# Patient Record
Sex: Male | Born: 1965 | Race: Black or African American | Hispanic: No | Marital: Married | Smoking: Current some day smoker
Health system: Southern US, Community
[De-identification: ages and names within clinical notes are randomized; demographics above are authoritative.]

## PROBLEM LIST (undated history)

## (undated) DIAGNOSIS — I10 Essential (primary) hypertension: Secondary | ICD-10-CM

## (undated) HISTORY — DX: Essential (primary) hypertension: I10

---

## 2013-04-30 ENCOUNTER — Ambulatory Visit: Payer: BC Managed Care – PPO

## 2013-04-30 ENCOUNTER — Ambulatory Visit (INDEPENDENT_AMBULATORY_CARE_PROVIDER_SITE_OTHER): Payer: BC Managed Care – PPO | Admitting: Family Medicine

## 2013-04-30 VITALS — BP 110/80 | HR 77 | Temp 98.0°F | Resp 16 | Ht 73.5 in | Wt 226.0 lb

## 2013-04-30 DIAGNOSIS — M62830 Muscle spasm of back: Secondary | ICD-10-CM

## 2013-04-30 DIAGNOSIS — M545 Low back pain, unspecified: Secondary | ICD-10-CM

## 2013-04-30 DIAGNOSIS — S39012A Strain of muscle, fascia and tendon of lower back, initial encounter: Secondary | ICD-10-CM

## 2013-04-30 LAB — POCT URINALYSIS DIPSTICK
Bilirubin, UA: NEGATIVE
Blood, UA: NEGATIVE
Glucose, UA: NEGATIVE
KETONES UA: NEGATIVE
Nitrite, UA: NEGATIVE
PH UA: 6
Protein, UA: NEGATIVE
Spec Grav, UA: 1.025
Urobilinogen, UA: 0.2

## 2013-04-30 MED ORDER — MELOXICAM 7.5 MG PO TABS: 7.5000 mg | ORAL_TABLET | Freq: Every day | ORAL | Status: DC

## 2013-04-30 MED ORDER — CYCLOBENZAPRINE HCL 5 MG PO TABS
ORAL_TABLET | ORAL | Status: DC
Start: 2013-04-30 — End: 2024-01-22

## 2013-04-30 NOTE — Progress Notes (Signed)
Subjective:    Patient ID: Danny Lopez, male    DOB: 11/01/1965, 48 y.o.   MRN: [id number]  HPI Danny Lopez is a 48 y.o. male  Started about 6 days ago - shooting pain, R lower back only, NKI, no leg radiation. No trouble urinating, no hematuria, no fever, no rash.  No bowel or bladder incontinence, no saddle anesthesia, no lower extremity weakness.  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   Tx: heating pad, massage - min improvement.   No history of kidney stones known.    There are no active problems to display for this patient.  No past medical history on file. No past surgical history on file. No Known Allergies Prior to Admission medications   Not on File   History   Social History  . Marital Status: Married    Spouse Name: N/A    Number of Children: N/A  . Years of Education: N/A   Occupational History  . Not on file.   Social History Main Topics  . Smoking status: Never Smoker   . Smokeless tobacco: Not on file  . Alcohol Use: Not on file  . Drug Use: Not on file  . Sexual Activity: Not on file   Other Topics Concern  . Not on file   Social History Narrative  . No narrative on file       Review of Systems  Genitourinary: Negative for dysuria, urgency, frequency, hematuria and difficulty urinating.  Musculoskeletal: Positive for back pain and myalgias.  Skin: Negative for rash.  Neurological: Negative for weakness.       Objective:   Physical Exam  Vitals reviewed. Constitutional: He is oriented to person, place, and time. He appears well-developed and well-nourished.  HENT:  Head: Normocephalic and atraumatic.  Neck: Normal range of motion.  Cardiovascular: Normal rate, regular rhythm, normal heart sounds and intact distal pulses.   Pulmonary/Chest: Effort normal and breath sounds normal.  Abdominal: Soft. There is no tenderness.  Musculoskeletal: He exhibits tenderness.       Lumbar back: He  exhibits tenderness and spasm (ttp, spasm - R lower paraspinals. ). He exhibits normal range of motion and no bony tenderness.       Back:  SLR negative bilaterally - seated.   Neurological: He is alert and oriented to person, place, and time. He has normal strength. No sensory deficit.  Reflex Scores:      Patellar reflexes are 2+ on the right side and 2+ on the left side.      Achilles reflexes are 2+ on the right side and 2+ on the left side. Able to heel and toe walk without difficulty. unable to illicit LE reflexes, but equal.   Skin: Skin is warm and dry. No rash noted.  Psychiatric: He has a normal mood and affect. His behavior is normal.   Filed Vitals:   04/30/13 0838  BP: 110/80  Pulse: 77  Temp: 98 F (36.7 C)  Resp: 16  Height: 6' 1.5" (1.867 m)  Weight: 226 lb (102.513 kg)  SpO2: 98%     [facility] reading (PRIMARY) by  Dr. Neva Seat: LS spine: sacralization of L5, decreased disc space with bridging osteophyte L4-5, spondylosis L3. No apparent listhesis.   Results for orders placed in visit on 04/30/13  POCT URINALYSIS DIPSTICK      Result Value Ref Range   Color, UA yellow     Clarity, UA  clear     Glucose, UA neg     Bilirubin, UA neg     Ketones, UA neg     Spec Grav, UA 1.025     Blood, UA neg     pH, UA 6.0     Protein, UA neg     Urobilinogen, UA 0.2     Nitrite, UA neg     Leukocytes, UA Trace        Assessment & Plan:   Danny Lopez is a 48 y.o. male Back pain, lumbosacral - Plan: POCT urinalysis dipstick, DG Lumbar Spine Complete, meloxicam (MOBIC) 7.5 MG tablet, cyclobenzaprine (FLEXERIL) 5 MG tablet  Strain of lumbar paraspinal muscle - Plan: meloxicam (MOBIC) 7.5 MG tablet, cyclobenzaprine (FLEXERIL) 5 MG tablet  Muscle spasm of back - Plan: meloxicam (MOBIC) 7.5 MG tablet, cyclobenzaprine (FLEXERIL) 5 MG tablet  Underlying spondylosis/DDD with likely strain/overuse with secondary spasm. mobic 1-2 QD, flexeril up to every 8 hours. Back care manual  and sx care discussed, rtc precautions discussed.    Meds ordered this encounter  Medications  . meloxicam (MOBIC) 7.5 MG tablet    Sig: Take 1-2 tablets (7.5-15 mg total) by mouth daily.    Dispense:  30 tablet    Refill:  0  . cyclobenzaprine (FLEXERIL) 5 MG tablet    Sig: 1 pill by mouth up to every 8 hours as needed. Start with one pill by mouth each bedtime as needed due to sedation    Dispense:  15 tablet    Refill:  0   Patient Instructions  You likely have a sprained ligament or strained muscle in the low back, which can lead to some muscle spasm as well. Try the mobic each morning (do not combine with other over the counter pain relievers), flexeril at night if needed.  Heat or ice to area as needed and the other treatments and exercises in the back care manual as tolerated. If not able to return to work in few days as discussed, or if any worsening sooner - return for recheck. Return to the clinic or go to the nearest emergency room if any of your symptoms worsen or new symptoms occur. Lumbosacral Strain Lumbosacral strain is a strain of any of the parts that make up your lumbosacral vertebrae. Your lumbosacral vertebrae are the bones that make up the lower third of your backbone. Your lumbosacral vertebrae are held together by muscles and tough, fibrous tissue (ligaments).  CAUSES  A sudden blow to your back can cause lumbosacral strain. Also, anything that causes an excessive stretch of the muscles in the low back can cause this strain. This is typically seen when people exert themselves strenuously, fall, lift heavy objects, bend, or crouch repeatedly. RISK FACTORS  Physically demanding work.  Participation in pushing or pulling sports or sports that require sudden twist of the back (tennis, golf, baseball).  Weight lifting.  Excessive lower back curvature.  Forward-tilted pelvis.  Weak back or abdominal muscles or both.  Tight hamstrings. SIGNS AND SYMPTOMS    Lumbosacral strain may cause pain in the area of your injury or pain that moves (radiates) down your leg.  DIAGNOSIS Your health care provider can often diagnose lumbosacral strain through a physical exam. In some cases, you may need tests such as X-ray exams.  TREATMENT  Treatment for your lower back injury depends on many factors that your clinician will have to evaluate. However, most treatment will include the use of anti-inflammatory medicines. HOME  CARE INSTRUCTIONS   Avoid hard physical activities (tennis, racquetball, waterskiing) if you are not in proper physical condition for it. This may aggravate or create problems.  If you have a back problem, avoid sports requiring sudden body movements. Swimming and walking are generally safer activities.  Maintain good posture.  Maintain a healthy weight.  For acute conditions, you may put ice on the injured area.  Put ice in a plastic bag.  Place a towel between your skin and the bag.  Leave the ice on for 20 minutes, 2 3 times a day.  When the low back starts healing, stretching and strengthening exercises may be recommended. SEEK MEDICAL CARE IF:  Your back pain is getting worse.  You experience severe back pain not relieved with medicines. SEEK IMMEDIATE MEDICAL CARE IF:   You have numbness, tingling, weakness, or problems with the use of your arms or legs.  There is a change in bowel or bladder control.  You have increasing pain in any area of the body, including your belly (abdomen).  You notice shortness of breath, dizziness, or feel faint.  You feel sick to your stomach (nauseous), are throwing up (vomiting), or become sweaty.  You notice discoloration of your toes or legs, or your feet get very cold. MAKE SURE YOU:   Understand these instructions.  Will watch your condition.  Will get help right away if you are not doing well or get worse. Document Released: 11/24/2004 Document Revised: 12/05/2012 Document  Reviewed: 10/03/2012 [facility] Patient Information 2014 [place]ExitCare, [place].

## 2013-04-30 NOTE — Patient Instructions (Signed)
You likely have a sprained ligament or strained muscle in the low back, which can lead to some muscle spasm as well. Try the mobic each morning (do not combine with other over the counter pain relievers), flexeril at night if needed.  Heat or ice to area as needed and the other treatments and exercises in the back care manual as tolerated. If not able to return to work in few days as discussed, or if any worsening sooner - return for recheck. Return to the clinic or go to the nearest emergency room if any of your symptoms worsen or new symptoms occur. Lumbosacral Strain Lumbosacral strain is a strain of any of the parts that make up your lumbosacral vertebrae. Your lumbosacral vertebrae are the bones that make up the lower third of your backbone. Your lumbosacral vertebrae are held together by muscles and tough, fibrous tissue (ligaments).  CAUSES  A sudden blow to your back can cause lumbosacral strain. Also, anything that causes an excessive stretch of the muscles in the low back can cause this strain. This is typically seen when people exert themselves strenuously, fall, lift heavy objects, bend, or crouch repeatedly. RISK FACTORS  Physically demanding work.  Participation in pushing or pulling sports or sports that require sudden twist of the back (tennis, golf, baseball).  Weight lifting.  Excessive lower back curvature.  Forward-tilted pelvis.  Weak back or abdominal muscles or both.  Tight hamstrings. SIGNS AND SYMPTOMS  Lumbosacral strain may cause pain in the area of your injury or pain that moves (radiates) down your leg.  DIAGNOSIS Your health care provider can often diagnose lumbosacral strain through a physical exam. In some cases, you may need tests such as X-ray exams.  TREATMENT  Treatment for your lower back injury depends on many factors that your clinician will have to evaluate. However, most treatment will include the use of anti-inflammatory medicines. HOME CARE  INSTRUCTIONS   Avoid hard physical activities (tennis, racquetball, waterskiing) if you are not in proper physical condition for it. This may aggravate or create problems.  If you have a back problem, avoid sports requiring sudden body movements. Swimming and walking are generally safer activities.  Maintain good posture.  Maintain a healthy weight.  For acute conditions, you may put ice on the injured area.  Put ice in a plastic bag.  Place a towel between your skin and the bag.  Leave the ice on for 20 minutes, 2 3 times a day.  When the low back starts healing, stretching and strengthening exercises may be recommended. SEEK MEDICAL CARE IF:  Your back pain is getting worse.  You experience severe back pain not relieved with medicines. SEEK IMMEDIATE MEDICAL CARE IF:   You have numbness, tingling, weakness, or problems with the use of your arms or legs.  There is a change in bowel or bladder control.  You have increasing pain in any area of the body, including your belly (abdomen).  You notice shortness of breath, dizziness, or feel faint.  You feel sick to your stomach (nauseous), are throwing up (vomiting), or become sweaty.  You notice discoloration of your toes or legs, or your feet get very cold. MAKE SURE YOU:   Understand these instructions.  Will watch your condition.  Will get help right away if you are not doing well or get worse. Document Released: 11/24/2004 Document Revised: 12/05/2012 Document Reviewed: 10/03/2012 [facility] Patient Information 2014 [place]ExitCare, [place].

## 2015-03-25 IMAGING — CR DG LUMBAR SPINE COMPLETE 4+V
6 series · 6 of 6 positions shown · non-contrast
Comparison: None.

CLINICAL DATA: Right lower back pain.

EXAM:
LUMBAR SPINE - COMPLETE 4+ VIEW

[AP (1 of 2)]
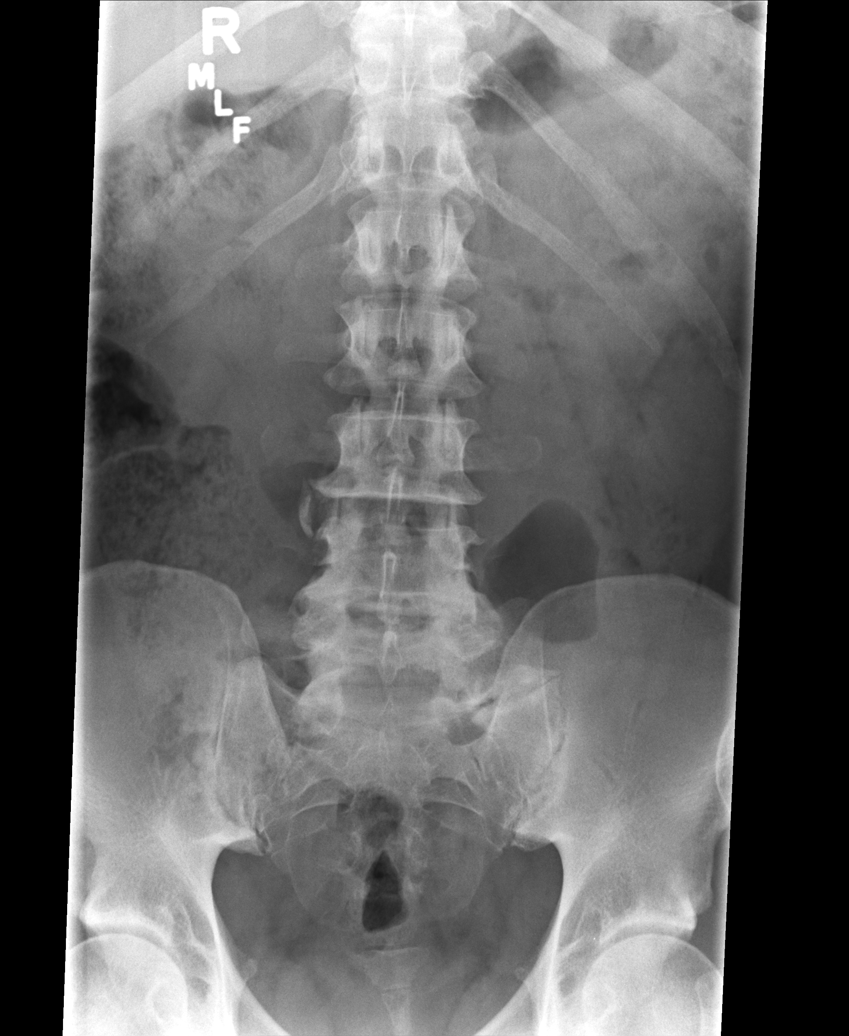

[rpo]
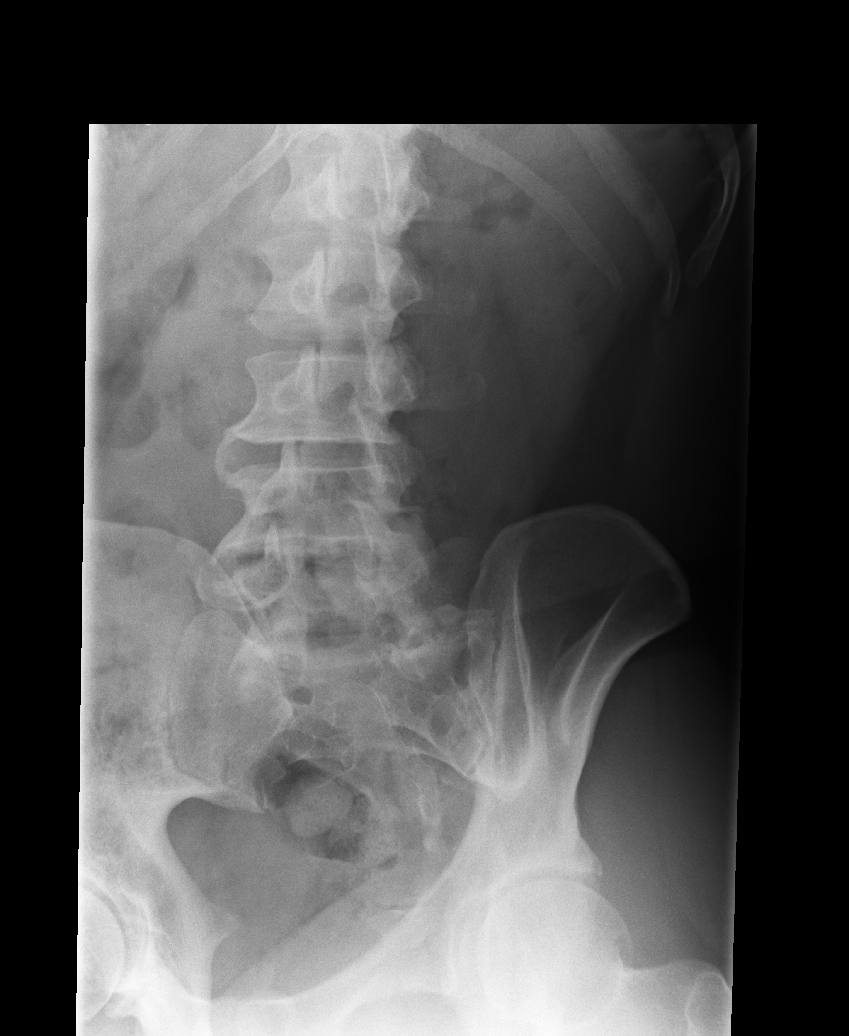

[lpo]
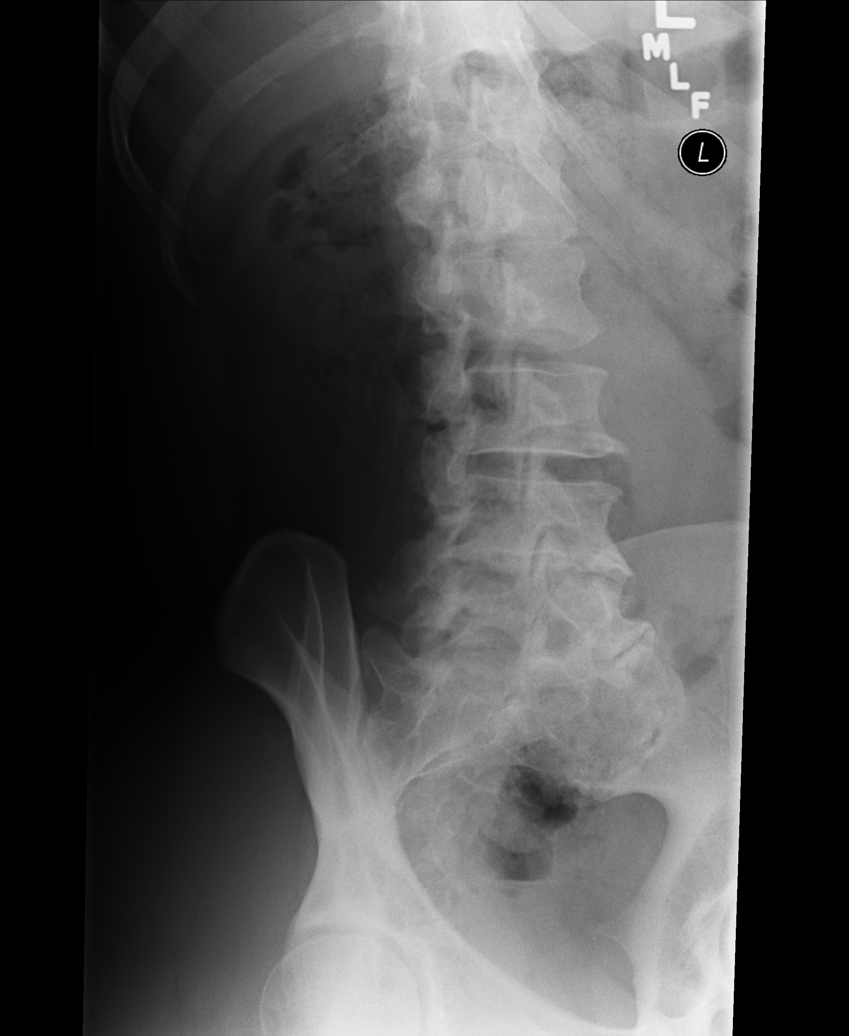

[lateral]
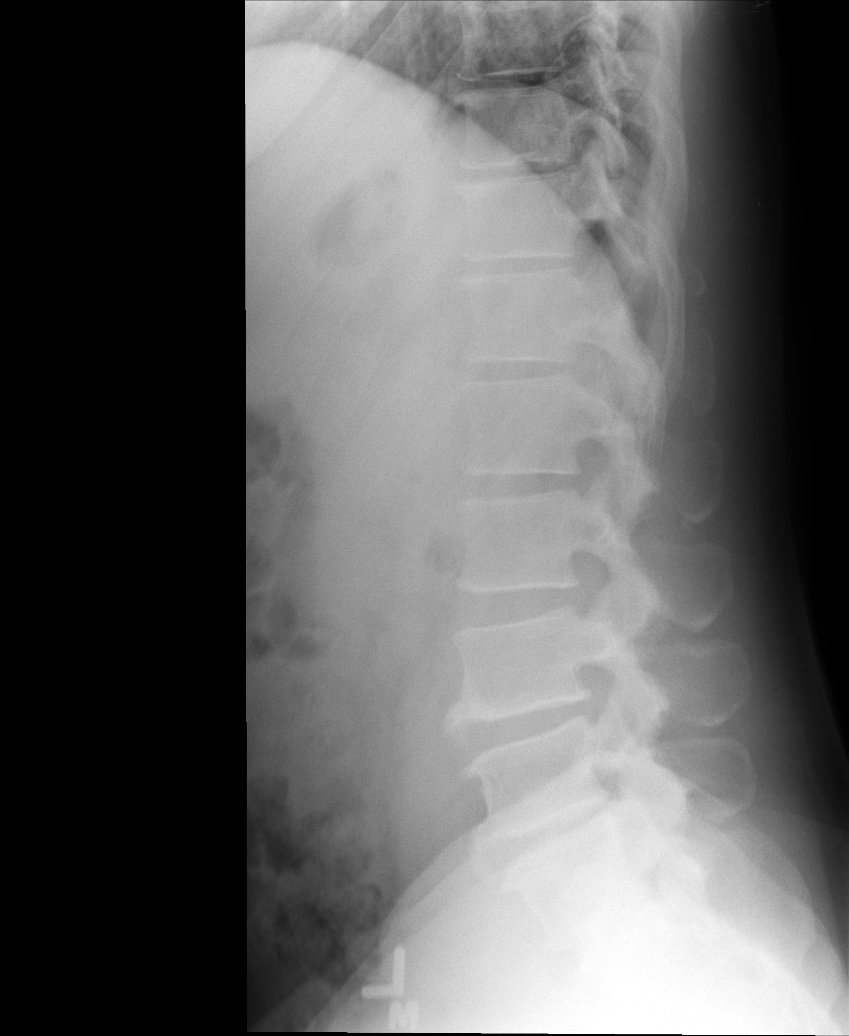

[l5 s1]
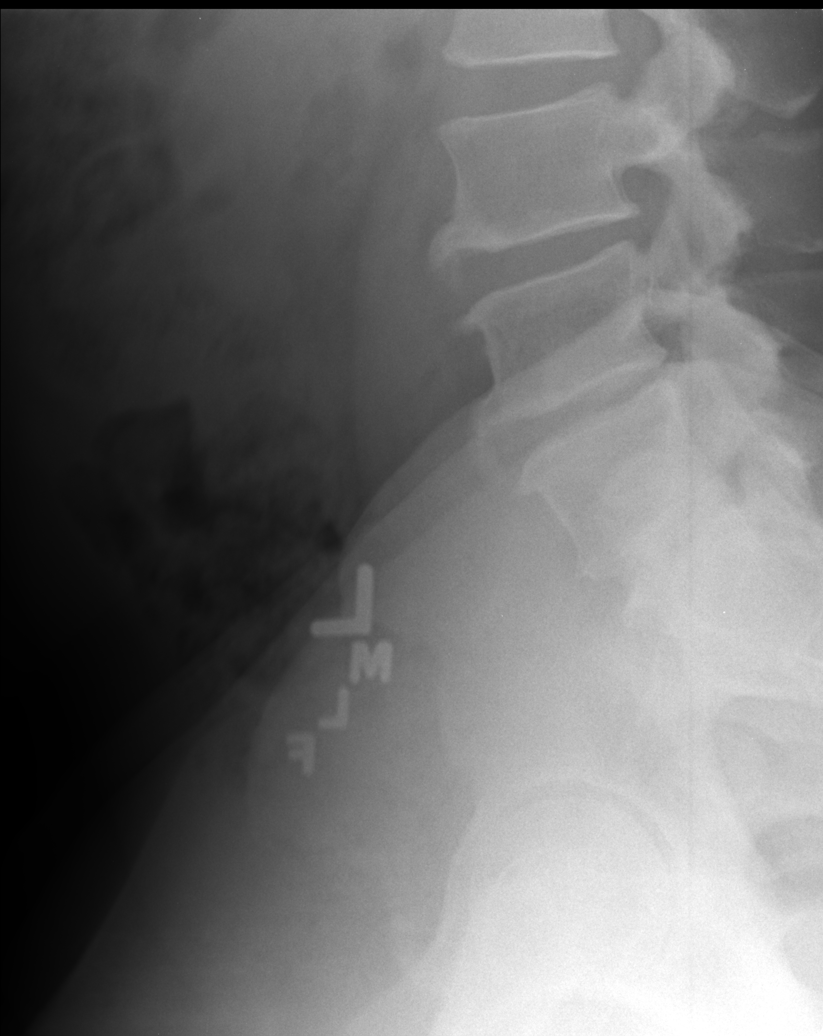

[AP (2 of 2)]
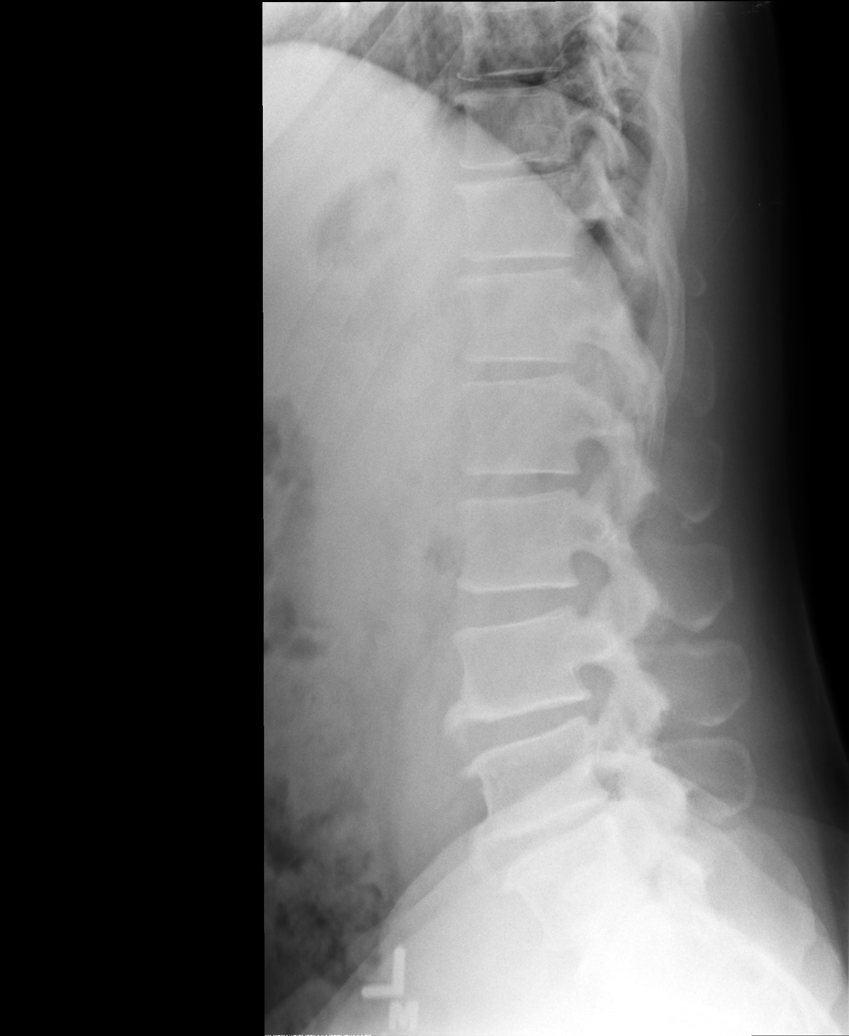

[6 of 6 positions shown; findings below may reference images not displayed]

FINDINGS: No fracture or spondylolisthesis. There is a transitional
lumbosacral vertebrae. There is moderate loss disc height at L4-L5
with mild loss of disc height at L3-L4 and L5-S1. There is endplate
spurring at these levels. Facet degenerative changes are noted,
relatively mild, at L5-S1.
IMPRESSION: No fracture or acute finding. Degenerative changes of the lower
lumbar spine as described.

## 2022-04-04 ENCOUNTER — Ambulatory Visit: Payer: Self-pay

## 2022-04-04 ENCOUNTER — Other Ambulatory Visit: Payer: Self-pay | Admitting: Family

## 2022-04-04 DIAGNOSIS — M25561 Pain in right knee: Secondary | ICD-10-CM

## 2023-07-31 ENCOUNTER — Other Ambulatory Visit: Payer: Self-pay | Admitting: Occupational Medicine

## 2023-07-31 ENCOUNTER — Encounter: Payer: Self-pay | Admitting: Occupational Medicine

## 2023-07-31 ENCOUNTER — Ambulatory Visit
Admission: RE | Admit: 2023-07-31 | Discharge: 2023-07-31 | Disposition: A | Discharge status: Home / Self Care | Source: Ambulatory Visit | Attending: Occupational Medicine | Admitting: Occupational Medicine

## 2023-07-31 DIAGNOSIS — S335XXA Sprain of ligaments of lumbar spine, initial encounter: Secondary | ICD-10-CM

## 2023-07-31 DIAGNOSIS — S53402A Unspecified sprain of left elbow, initial encounter: Secondary | ICD-10-CM

## 2023-07-31 DIAGNOSIS — S139XXA Sprain of joints and ligaments of unspecified parts of neck, initial encounter: Secondary | ICD-10-CM

## 2023-09-18 ENCOUNTER — Ambulatory Visit: Payer: Self-pay | Admitting: Physician Assistant

## 2023-12-11 ENCOUNTER — Encounter: Payer: Self-pay | Admitting: Nurse Practitioner

## 2023-12-11 ENCOUNTER — Ambulatory Visit: Payer: Self-pay | Admitting: Nurse Practitioner

## 2023-12-11 VITALS — BP 140/90 | HR 96 | Temp 98.5°F | Ht 73.0 in | Wt 258.0 lb

## 2023-12-11 DIAGNOSIS — Z1211 Encounter for screening for malignant neoplasm of colon: Secondary | ICD-10-CM

## 2023-12-11 DIAGNOSIS — Z7689 Persons encountering health services in other specified circumstances: Secondary | ICD-10-CM

## 2023-12-11 DIAGNOSIS — Z6834 Body mass index (BMI) 34.0-34.9, adult: Secondary | ICD-10-CM

## 2023-12-11 DIAGNOSIS — E6609 Other obesity due to excess calories: Secondary | ICD-10-CM | POA: Diagnosis not present

## 2023-12-11 DIAGNOSIS — Z136 Encounter for screening for cardiovascular disorders: Secondary | ICD-10-CM

## 2023-12-11 DIAGNOSIS — Z1159 Encounter for screening for other viral diseases: Secondary | ICD-10-CM

## 2023-12-11 DIAGNOSIS — M2550 Pain in unspecified joint: Secondary | ICD-10-CM | POA: Insufficient documentation

## 2023-12-11 DIAGNOSIS — I10 Essential (primary) hypertension: Secondary | ICD-10-CM

## 2023-12-11 DIAGNOSIS — E66811 Obesity, class 1: Secondary | ICD-10-CM

## 2023-12-11 DIAGNOSIS — Z114 Encounter for screening for human immunodeficiency virus [HIV]: Secondary | ICD-10-CM

## 2023-12-11 DIAGNOSIS — Z2821 Immunization not carried out because of patient refusal: Secondary | ICD-10-CM

## 2023-12-11 DIAGNOSIS — Z13220 Encounter for screening for lipoid disorders: Secondary | ICD-10-CM

## 2023-12-11 DIAGNOSIS — M549 Dorsalgia, unspecified: Secondary | ICD-10-CM | POA: Insufficient documentation

## 2023-12-11 DIAGNOSIS — Z13228 Encounter for screening for other metabolic disorders: Secondary | ICD-10-CM

## 2023-12-11 LAB — CMP14+EGFR
ALT: 26 IU/L (ref 0–44)
AST: 22 IU/L (ref 0–40)
Albumin: 4.7 g/dL (ref 3.8–4.9)
Alkaline Phosphatase: 94 IU/L (ref 47–123)
BUN/Creatinine Ratio: 11 (ref 9–20)
BUN: 13 mg/dL (ref 6–24)
Bilirubin Total: 0.3 mg/dL (ref 0.0–1.2)
CO2: 25 mmol/L (ref 20–29)
Calcium: 9.9 mg/dL (ref 8.7–10.2)
Chloride: 103 mmol/L (ref 96–106)
Creatinine, Ser: 1.18 mg/dL (ref 0.76–1.27)
Globulin, Total: 2.6 g/dL (ref 1.5–4.5)
Glucose: 110 mg/dL — ABNORMAL HIGH (ref 70–99)
Potassium: 4.6 mmol/L (ref 3.5–5.2)
Sodium: 140 mmol/L (ref 134–144)
Total Protein: 7.3 g/dL (ref 6.0–8.5)
eGFR: 72 mL/min/1.73 (ref >59)

## 2023-12-11 MED ORDER — HYDROCHLOROTHIAZIDE 12.5 MG PO TABS: 12.5000 mg | ORAL_TABLET | Freq: Every day | ORAL | 1 refills | Status: AC | PRN

## 2023-12-11 MED ORDER — AMLODIPINE BESYLATE 2.5 MG PO TABS: 2.5000 mg | ORAL_TABLET | Freq: Every day | ORAL | 1 refills | Status: AC

## 2023-12-11 NOTE — Progress Notes (Signed)
 Danny Lopez, CMA,acting as a Neurosurgeon for [name], FNP.,have documented all relevant documentation on the behalf of [name], FNP,as directed by  [name], FNP while in the presence of [name], FNP.  Subjective:  Patient ID: Danny Lopez , male    DOB: 02-Aug-1965 , 58 y.o.   MRN: [id number]  Chief Complaint  Patient presents with   Establish Care    Patient presents today to establish care , Patient reports compliance with medication. Patient denies any chest pain, SOB, or headaches. Patient has no concerns today.    Hypertension    Patient reports he was diagnosed with hypertension around July of 2025.    HPI Discussed the use of AI scribe software for clinical note transcription with the patient, who gave verbal consent to proceed.  History of Present Illness Danny Lopez is a 58 year old male with hypertension who presents for management of high blood pressure.  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 His blood pressure has been elevated for the past year, initially noted during a routine check for his DOT physical. He was previously prescribed lisinopril and hydrochlorothiazide but discontinued them after a week due to side effects, including a pounding sensation in his chest and concerns about kidney and liver issues. He has been taking the medication sporadically since then.  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 He has a history of dark urine, which he attributes to inadequate water intake. No headaches, dizziness, lightheadedness, or issues with urination, except for increased frequency this past week, which he attributes to increased fluid intake.  His family history includes hypertension and diabetes. He has not had a colonoscopy and is behind on routine health screenings. He retired from [employer] three years ago and does not currently use [facility] services.  He has four living children, with one deceased son who passed away at age 78 from non-health-related causes.   He has not seen in a PCP in July. He is retired Hotel manager 3 years ago.  He is not going to the [place] for any health issues at this time. 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 He tries to exercise. He is trying to eat a little better. Married, 5 children but one is deceased 22 son, 64 daughter, 35 son, 42 son. Son 44 y/o deceased non health related.      Past Medical History:  Diagnosis Date   Hypertension      Family History  Problem Relation Age of Onset   Diabetes Mother    Hypertension Mother    Diabetes Father    Hyperlipidemia Father    Diabetes Sister    Diabetes Brother      Current Outpatient Medications:    amLODipine (NORVASC) 2.5 MG tablet, Take 1 tablet (2.5 mg total) by mouth daily., Disp: 90 tablet, Rfl: 1   hydrochlorothiazide (HYDRODIURIL) 12.5 MG tablet, Take 1 tablet (12.5 mg total) by mouth daily as needed. For leg swelling, Disp: 30 tablet, Rfl: 1   cyclobenzaprine  (FLEXERIL ) 5 MG tablet, 1 pill by mouth up to every 8 hours as needed. Start with one pill by mouth each bedtime as needed due to sedation (Patient not taking: Reported on 12/11/2023), Disp: 15 tablet, Rfl: 0   lisinopril-hydrochlorothiazide (ZESTORETIC) 10-12.5 MG tablet, Take 1 tablet by mouth daily. (Patient not taking: Reported on  12/11/2023), Disp: , Rfl:    meloxicam  (MOBIC ) 7.5 MG tablet, Take 1-2 tablets (7.5-15 mg total) by mouth daily. (Patient not taking: Reported on 12/11/2023), Disp: 30 tablet, Rfl: 0   No Known Allergies   Review of Systems  Constitutional: Negative.   Respiratory: Negative.    Cardiovascular: Negative.   Neurological: Negative.   Psychiatric/Behavioral: Negative.       Today's Vitals   12/11/23 0914 12/11/23 1012  BP: (!) 140/100 (!) 140/90  Pulse: 96   Temp: 98.5 F (36.9 C)   TempSrc: Oral   Weight: 258 lb (117  kg)   Height: 6' 1 (1.854 m)   PainSc: 0-No pain    Body mass index is 34.04 kg/m.  Wt Readings from Last 3 Encounters:  12/11/23 258 lb (117 kg)  04/30/13 226 lb (102.5 kg)     Objective:  Physical Exam Vitals and nursing note reviewed.  Constitutional:      General: He is not in acute distress.    Appearance: Normal appearance. He is obese.  Cardiovascular:     Rate and Rhythm: Normal rate and regular rhythm.     Pulses: Normal pulses.     Heart sounds: Normal heart sounds. No murmur heard. Pulmonary:     Effort: Pulmonary effort is normal. No respiratory distress.     Breath sounds: Normal breath sounds. No wheezing.  Neurological:     Mental Status: He is alert.     Assessment And Plan:  Establishing care with new doctor, encounter for  Primary hypertension Assessment & Plan: Hypertension diagnosed in July. Concerns about lisinopril and hydrochlorothiazide side effects. Lower extremity swelling possibly due to prolonged sitting and inadequate water intake. Discussed risk of chronic kidney disease and congestive heart failure. Amlodipine suggested as a better option for African Americans. - Prescribe amlodipine 2.5 mg daily. - Prescribe hydrochlorothiazide as needed for swelling. - Order blood tests to check kidney and liver functions. - Order EKG to assess heart rhythm. EKG done with t abnormality  - Advise wearing compression socks to manage swelling. - Encourage low salt diet and increased physical activity. - Schedule follow-up visit in 4-6 weeks to assess medication efficacy and blood pressure control. - Order blood tests in 2 weeks to check for adverse reactions to new medication.  Orders: -     amLODIPine Besylate; Take 1 tablet (2.5 mg total) by mouth daily.  Dispense: 90 tablet; Refill: 1 -     hydroCHLOROthiazide; Take 1 tablet (12.5 mg total) by mouth daily as needed. For leg swelling  Dispense: 30 tablet; Refill: 1 -     EKG 12-Lead -     CMP14+EGFR -      Microalbumin / creatinine urine ratio  Tetanus, diphtheria, and acellular pertussis (Tdap) vaccination declined  Herpes zoster vaccination declined  Screening for colon cancer Assessment & Plan: No history of colonoscopy. Discussed importance of screening for colon cancer, especially given age and being overdue for screening.  Orders: -     Ambulatory referral to Gastroenterology  Class 1 obesity due to excess calories with body mass index (BMI) of 34.0 to 34.9 in adult, unspecified whether serious comorbidity present Assessment & Plan: Weight gain of 27 pounds over the last ten years. Discussed importance of weight loss and exercise in managing hypertension and overall health. - Encourage regular exercise and a healthy diet to promote weight loss. - Advise on low salt diet to aid in blood pressure control.   Encounter for screening for  metabolic disorder -     Hemoglobin A1c  Encounter for lipid screening for cardiovascular disease -     Lipid panel  Encounter for HIV (human immunodeficiency virus) test -     HIV Antibody (routine testing w rflx)  Encounter for hepatitis C screening test for low risk patient -     Hepatitis C antibody    Return for 4-6 week BP f/u .  Patient was given opportunity to ask questions. Patient verbalized understanding of the plan and was able to repeat key elements of the plan. All questions were answered to their satisfaction.    Danny [name], FNP, have reviewed all documentation for this visit. The documentation on 12/11/23 for the exam, diagnosis, procedures, and orders are all accurate and complete.   IF YOU HAVE BEEN REFERRED TO A SPECIALIST, IT MAY TAKE 1-2 WEEKS TO SCHEDULE/PROCESS THE REFERRAL. IF YOU HAVE NOT HEARD FROM US /SPECIALIST IN TWO WEEKS, PLEASE GIVE US  A CALL AT [phone].

## 2023-12-12 ENCOUNTER — Ambulatory Visit: Payer: Self-pay | Admitting: Nurse Practitioner

## 2023-12-12 LAB — HEMOGLOBIN A1C
Est. average glucose Bld gHb Est-mCnc: 143 mg/dL
Hgb A1c MFr Bld: 6.6 % — ABNORMAL HIGH (ref 4.8–5.6)

## 2023-12-12 LAB — MICROALBUMIN / CREATININE URINE RATIO
Creatinine, Urine: 356.4 mg/dL
Microalb/Creat Ratio: 5 mg/g{creat} (ref 0–29)
Microalbumin, Urine: 18.3 ug/mL

## 2023-12-12 LAB — LIPID PANEL
Chol/HDL Ratio: 4.4 ratio (ref 0.0–5.0)
Cholesterol, Total: 167 mg/dL (ref 100–199)
HDL: 38 mg/dL — ABNORMAL LOW (ref >39)
LDL Chol Calc (NIH): 108 mg/dL — ABNORMAL HIGH (ref 0–99)
Triglycerides: 114 mg/dL (ref 0–149)
VLDL Cholesterol Cal: 21 mg/dL (ref 5–40)

## 2023-12-12 LAB — HEPATITIS C ANTIBODY: Hep C Virus Ab: NONREACTIVE

## 2023-12-12 LAB — HIV ANTIBODY (ROUTINE TESTING W REFLEX): HIV Screen 4th Generation wRfx: NONREACTIVE

## 2023-12-17 DIAGNOSIS — E66811 Obesity, class 1: Secondary | ICD-10-CM | POA: Insufficient documentation

## 2023-12-17 DIAGNOSIS — I10 Essential (primary) hypertension: Secondary | ICD-10-CM | POA: Insufficient documentation

## 2023-12-17 DIAGNOSIS — Z1211 Encounter for screening for malignant neoplasm of colon: Secondary | ICD-10-CM | POA: Insufficient documentation

## 2023-12-17 NOTE — Assessment & Plan Note (Signed)
 No history of colonoscopy. Discussed importance of screening for colon cancer, especially given age and being overdue for screening.

## 2023-12-17 NOTE — Assessment & Plan Note (Addendum)
 Hypertension diagnosed in July. Concerns about lisinopril and hydrochlorothiazide side effects. Lower extremity swelling possibly due to prolonged sitting and inadequate water intake. Discussed risk of chronic kidney disease and congestive heart failure. Amlodipine suggested as a better option for African Americans. - Prescribe amlodipine 2.5 mg daily. - Prescribe hydrochlorothiazide as needed for swelling. - Order blood tests to check kidney and liver functions. - Order EKG to assess heart rhythm. EKG done with t abnormality  - Advise wearing compression socks to manage swelling. - Encourage low salt diet and increased physical activity. - Schedule follow-up visit in 4-6 weeks to assess medication efficacy and blood pressure control. - Order blood tests in 2 weeks to check for adverse reactions to new medication.

## 2023-12-17 NOTE — Assessment & Plan Note (Signed)
 Weight gain of 27 pounds over the last ten years. Discussed importance of weight loss and exercise in managing hypertension and overall health. - Encourage regular exercise and a healthy diet to promote weight loss. - Advise on low salt diet to aid in blood pressure control.

## 2024-01-22 ENCOUNTER — Ambulatory Visit: Payer: Self-pay | Admitting: Nurse Practitioner

## 2024-01-22 ENCOUNTER — Encounter: Payer: Self-pay | Admitting: Nurse Practitioner

## 2024-01-22 VITALS — BP 130/86 | HR 84 | Temp 98.1°F | Ht 73.0 in | Wt 258.0 lb

## 2024-01-22 DIAGNOSIS — E6609 Other obesity due to excess calories: Secondary | ICD-10-CM | POA: Diagnosis not present

## 2024-01-22 DIAGNOSIS — Z6834 Body mass index (BMI) 34.0-34.9, adult: Secondary | ICD-10-CM | POA: Diagnosis not present

## 2024-01-22 DIAGNOSIS — E66811 Obesity, class 1: Secondary | ICD-10-CM | POA: Diagnosis not present

## 2024-01-22 DIAGNOSIS — I10 Essential (primary) hypertension: Secondary | ICD-10-CM | POA: Diagnosis not present

## 2024-01-22 NOTE — Patient Instructions (Addendum)
 Hypertension, Adult Hypertension is another name for high blood pressure. High blood pressure forces your heart to work harder to pump blood. This can cause problems over time. There are two numbers in a blood pressure reading. There is a top number (systolic) over a bottom number (diastolic). It is best to have a blood pressure that is below 120/80. What are the causes? The cause of this condition is not known. Some other conditions can lead to high blood pressure. What increases the risk? Some lifestyle factors can make you more likely to develop high blood pressure: Smoking. Not getting enough exercise or physical activity. Being overweight. Having too much fat, sugar, calories, or salt (sodium) in your diet. Drinking too much alcohol. Other risk factors include: Having any of these conditions: Heart disease. Diabetes. High cholesterol. Kidney disease. Obstructive sleep apnea. Having a family history of high blood pressure and high cholesterol. Age. The risk increases with age. Stress. What are the signs or symptoms? High blood pressure may not cause symptoms. Very high blood pressure (hypertensive crisis) may cause: Headache. Fast or uneven heartbeats (palpitations). Shortness of breath. Nosebleed. Vomiting or feeling like you may vomit (nauseous). Changes in how you see. Very bad chest pain. Feeling dizzy. Seizures. How is this treated? This condition is treated by making healthy lifestyle changes, such as: Eating healthy foods. Exercising more. Drinking less alcohol. Your doctor may prescribe medicine if lifestyle changes do not help enough and if: Your top number is above 130. Your bottom number is above 80. Your personal target blood pressure may vary. Follow these instructions at home: Eating and drinking  If told, follow the DASH eating plan. To follow this plan: Fill one half of your plate at each meal with fruits and vegetables. Fill one fourth of your plate  at each meal with whole grains. Whole grains include whole-wheat pasta, brown rice, and whole-grain bread. Eat or drink low-fat dairy products, such as skim milk or low-fat yogurt. Fill one fourth of your plate at each meal with low-fat (lean) proteins. Low-fat proteins include fish, chicken without skin, eggs, beans, and tofu. Avoid fatty meat, cured and processed meat, or chicken with skin. Avoid pre-made or processed food. Limit the amount of salt in your diet to less than 1,500 mg each day. Do not drink alcohol if: Your doctor tells you not to drink. You are pregnant, may be pregnant, or are planning to become pregnant. If you drink alcohol: Limit how much you have to: 0-1 drink a day for women. 0-2 drinks a day for men. Know how much alcohol is in your drink. In the [place] one drink equals one 12 oz bottle of beer (355 mL), one 5 oz glass of wine (148 mL), or one 1 oz glass of hard liquor (44 mL). Lifestyle  Work with your doctor to stay at a healthy weight or to lose weight. Ask your doctor what the best weight is for you. Get at least 30 minutes of exercise that causes your heart to beat faster (aerobic exercise) most days of the week. This may include walking, swimming, or biking. Get at least 30 minutes of exercise that strengthens your muscles (resistance exercise) at least 3 days a week. This may include lifting weights or doing Pilates. Do not smoke or use any products that contain nicotine or tobacco. If you need help quitting, ask your doctor. Check your blood pressure at home as told by your doctor. Keep all follow-up visits. Medicines Take over-the-counter and prescription medicines  only as told by your doctor. Follow directions carefully. Do not skip doses of blood pressure medicine. The medicine does not work as well if you skip doses. Skipping doses also puts you at risk for problems. Ask your doctor about side effects or reactions to medicines that you should watch  for. Contact a doctor if: You think you are having a reaction to the medicine you are taking. You have headaches that keep coming back. You feel dizzy. You have swelling in your ankles. You have trouble with your vision. Get help right away if: You get a very bad headache. You start to feel mixed up (confused). You feel weak or numb. You feel faint. You have very bad pain in your: Chest. Belly (abdomen). You vomit more than once. You have trouble breathing. These symptoms may be an emergency. Get help right away. Call 911. Do not wait to see if the symptoms will go away. Do not drive yourself to the hospital. Summary Hypertension is another name for high blood pressure. High blood pressure forces your heart to work harder to pump blood. For most people, a normal blood pressure is less than 120/80. Making healthy choices can help lower blood pressure. If your blood pressure does not get lower with healthy choices, you may need to take medicine. This information is not intended to replace advice given to you by your health care provider. Make sure you discuss any questions you have with your health care provider. Located in: Danny Lopez [facility] [address] Address: [address], [place], [place] [address] Phone: [phone]    Document Revised: 12/03/2020 Document Reviewed: 12/03/2020 Elsevier Patient Education  2024 [employer].

## 2024-01-22 NOTE — Progress Notes (Signed)
 I,[name], CMA,acting as a neurosurgeon for [employer], FNP.,have documented all relevant documentation on the behalf of Danny Ada, FNP,as directed by  Danny Ada, FNP while in the presence of Danny Ada, FNP.  Subjective:  Patient ID: Danny Lopez , male    DOB: 04-02-1965 , 58 y.o.   MRN: [id number]  Chief Complaint  Patient presents with   Hypertension    Patient presents today for a blood pressure check. Patient reports compliance with his meds. Patient denies having chest pain, sob or headaches at this time. Patient doesn't have any questions or concerns at this time.    HPI  Hypertension This is a chronic problem. The current episode started more than 1 month ago. The problem has been gradually improving since onset. The problem is controlled. Past treatments include calcium channel blockers and diuretics. There are no compliance problems.  There is no history of chronic renal disease.     Past Medical History:  Diagnosis Date   Hypertension      Family History  Problem Relation Age of Onset   Diabetes Mother    Hypertension Mother    Diabetes Father    Hyperlipidemia Father    Diabetes Sister    Diabetes Brother      Current Outpatient Medications:    amLODipine  (NORVASC ) 2.5 MG tablet, Take 1 tablet (2.5 mg total) by mouth daily., Disp: 90 tablet, Rfl: 1   hydrochlorothiazide  (HYDRODIURIL ) 12.5 MG tablet, Take 1 tablet (12.5 mg total) by mouth daily as needed. For leg swelling (Patient not taking: Reported on 01/22/2024), Disp: 30 tablet, Rfl: 1   lisinopril-hydrochlorothiazide  (ZESTORETIC) 10-12.5 MG tablet, Take 1 tablet by mouth daily. (Patient not taking: Reported on 01/22/2024), Disp: , Rfl:    No Known Allergies   Review of Systems  Constitutional: Negative.   Respiratory: Negative.    Cardiovascular: Negative.   Neurological: Negative.   Psychiatric/Behavioral: Negative.       Today's Vitals   01/22/24 1140 01/22/24 1215  BP: (!) 140/90  130/86  Pulse: 84   Temp: 98.1 F (36.7 C)   TempSrc: Oral   Weight: 258 lb (117 kg)   Height: 6' 1 (1.854 m)   PainSc: 0-No pain    Body mass index is 34.04 kg/m.  Wt Readings from Last 3 Encounters:  01/22/24 258 lb (117 kg)  12/11/23 258 lb (117 kg)  04/30/13 226 lb (102.5 kg)     Objective:  Physical Exam Vitals and nursing note reviewed.  Constitutional:      General: He is not in acute distress.    Appearance: Normal appearance. He is obese.  Cardiovascular:     Rate and Rhythm: Normal rate and regular rhythm.     Pulses: Normal pulses.     Heart sounds: Normal heart sounds. No murmur heard. Pulmonary:     Effort: Pulmonary effort is normal. No respiratory distress.     Breath sounds: Normal breath sounds. No wheezing.  Neurological:     Mental Status: He is alert.         Assessment And Plan:   Assessment & Plan Primary hypertension Blood pressure improved after repeat. Continue amlodipine  and hydrochlorothiazide . Continue limiting intake of high salt foods.  Class 1 obesity due to excess calories with body mass index (BMI) of 34.0 to 34.9 in adult, unspecified whether serious comorbidity present He is encouraged to strive for BMI less than 30 to decrease cardiac risk. Advised to aim for at least 150 minutes  of exercise per week.  No orders of the defined types were placed in this encounter.    Return in about 3 months (around 04/23/2024) for bpc.  Patient was given opportunity to ask questions. Patient verbalized understanding of the plan and was able to repeat key elements of the plan. All questions were answered to their satisfaction.    LILLETTE Danny Ada, FNP, have reviewed all documentation for this visit. The documentation on 01/22/24 for the exam, diagnosis, procedures, and orders are all accurate and complete.   IF YOU HAVE BEEN REFERRED TO A SPECIALIST, IT MAY TAKE 1-2 WEEKS TO SCHEDULE/PROCESS THE REFERRAL. IF YOU HAVE NOT HEARD FROM US /SPECIALIST  IN TWO WEEKS, PLEASE GIVE US  A CALL AT [phone].

## 2024-02-01 NOTE — Assessment & Plan Note (Signed)
 Blood pressure improved after repeat. Continue amlodipine  and hydrochlorothiazide . Continue limiting intake of high salt foods.

## 2024-02-01 NOTE — Assessment & Plan Note (Addendum)
 He is encouraged to strive for BMI less than 30 to decrease cardiac risk. Advised to aim for at least 150 minutes of exercise per week.

## 2024-04-01 ENCOUNTER — Ambulatory Visit: Admitting: Nurse Practitioner
